# Patient Record
Sex: Female | Born: 1990 | Race: Black or African American | Hispanic: No | Marital: Single | Smoking: Current every day smoker
Health system: Southern US, Community
[De-identification: ages and names within clinical notes are randomized; demographics above are authoritative.]

---
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
## 2011-12-25 ENCOUNTER — Emergency Department (HOSPITAL_BASED_OUTPATIENT_CLINIC_OR_DEPARTMENT_OTHER)
Admission: EM | Admit: 2011-12-25 | Discharge: 2011-12-25 | Disposition: A | Discharge status: Home / Self Care | Payer: No Typology Code available for payment source | Attending: Emergency Medicine | Admitting: Emergency Medicine

## 2011-12-25 ENCOUNTER — Encounter (HOSPITAL_BASED_OUTPATIENT_CLINIC_OR_DEPARTMENT_OTHER): Payer: Self-pay | Admitting: Emergency Medicine

## 2011-12-25 ENCOUNTER — Emergency Department (INDEPENDENT_AMBULATORY_CARE_PROVIDER_SITE_OTHER): Payer: No Typology Code available for payment source

## 2011-12-25 DIAGNOSIS — N10 Acute tubulo-interstitial nephritis: Secondary | ICD-10-CM | POA: Insufficient documentation

## 2011-12-25 DIAGNOSIS — R079 Chest pain, unspecified: Secondary | ICD-10-CM

## 2011-12-25 DIAGNOSIS — R509 Fever, unspecified: Secondary | ICD-10-CM

## 2011-12-25 DIAGNOSIS — R109 Unspecified abdominal pain: Secondary | ICD-10-CM | POA: Insufficient documentation

## 2011-12-25 DIAGNOSIS — R110 Nausea: Secondary | ICD-10-CM | POA: Insufficient documentation

## 2011-12-25 DIAGNOSIS — F17200 Nicotine dependence, unspecified, uncomplicated: Secondary | ICD-10-CM | POA: Insufficient documentation

## 2011-12-25 LAB — URINALYSIS, ROUTINE W REFLEX MICROSCOPIC
Bilirubin Urine: NEGATIVE
Glucose, UA: NEGATIVE mg/dL
Ketones, ur: NEGATIVE mg/dL
Nitrite: POSITIVE — AB
Protein, ur: 30 mg/dL — AB
Specific Gravity, Urine: 1.021 (ref 1.005–1.030)
Urobilinogen, UA: 2 mg/dL — ABNORMAL HIGH (ref 0.0–1.0)
pH: 7.5 (ref 5.0–8.0)

## 2011-12-25 LAB — URINE MICROSCOPIC-ADD ON

## 2011-12-25 LAB — PREGNANCY, URINE: Preg Test, Ur: NEGATIVE

## 2011-12-25 MED ORDER — CEPHALEXIN 500 MG PO CAPS: ORAL_CAPSULE | ORAL | Status: AC

## 2011-12-25 MED ORDER — ACETAMINOPHEN 325 MG PO TABS
ORAL_TABLET | ORAL | Status: AC
  Filled 2011-12-25: qty 2

## 2011-12-25 MED ORDER — ACETAMINOPHEN 325 MG PO TABS
650.0000 mg | ORAL_TABLET | Freq: Once | ORAL | Status: AC
  Administered 2011-12-25: 650 mg via ORAL

## 2011-12-25 MED ORDER — LIDOCAINE HCL (PF) 1 % IJ SOLN
INTRAMUSCULAR | Status: AC
  Administered 2011-12-25: 2.1 mL
  Filled 2011-12-25: qty 5

## 2011-12-25 MED ORDER — METOCLOPRAMIDE HCL 10 MG PO TABS: 10.0000 mg | ORAL_TABLET | Freq: Four times a day (QID) | ORAL | Status: AC | PRN

## 2011-12-25 MED ORDER — OXYCODONE-ACETAMINOPHEN 5-325 MG PO TABS: 2.0000 | ORAL_TABLET | ORAL | Status: AC | PRN

## 2011-12-25 MED ORDER — ONDANSETRON 8 MG PO TBDP: ORAL_TABLET | ORAL | Status: AC

## 2011-12-25 MED ORDER — CEFTRIAXONE SODIUM 1 G IJ SOLR
1.0000 g | Freq: Once | INTRAMUSCULAR | Status: AC
  Administered 2011-12-25: 1 g via INTRAMUSCULAR
  Filled 2011-12-25: qty 10

## 2011-12-25 NOTE — ED Notes (Signed)
Pt c/o left sided abd pain since fri. With nausea. Denies vomiting or diarrhea

## 2011-12-25 NOTE — ED Provider Notes (Signed)
This chart was scribed for Hurman Horn, MD by Wallis Mart. The patient was seen in room MH11/MH11 and the patient's care was started at 8:35 PM.   CSN: [id number]  Arrival date & time 12/25/11  1913   First MD Initiated Contact with Patient 12/25/11 2035      Chief Complaint  Patient presents with  . Abdominal Pain  . Nausea    (Consider location/radiation/quality/duration/timing/severity/associated sxs/prior treatment) HPI  Kiara Estrada is a 21 y.o. female who presents to the Emergency Department complaining of sudden onset, persistence of constant, gradually worsening,  moderate left sided abd/chest wall pain onset 3 days ago.  Pain worsens when breathing, twisting.  Pt c/o fever (current temp = 102.7) and nausea.   Pt denies cough, vomiting, diarrhea.   The pain radiates nowhere. There are no other associated symptoms and no other alleviating or aggravating factors. Pt denies drug use but drinks alcohol. History reviewed. No pertinent past medical history.  History reviewed. No pertinent past surgical history.  No family history on file.  History  Substance Use Topics  . Smoking status: Current Everyday Smoker  . Smokeless tobacco: Not on file  . Alcohol Use: No    OB History    Grav Para Term Preterm Abortions TAB SAB Ect Mult Living                  Review of Systems  Constitutional: Positive for fever.       10 Systems reviewed and are negative for acute change except as noted in the HPI.  HENT: Negative for congestion.   Eyes: Negative for discharge and redness.  Respiratory: Negative for cough and shortness of breath.   Cardiovascular: Negative for chest pain.  Gastrointestinal: Positive for nausea and abdominal pain. Negative for vomiting.  Musculoskeletal: Negative for back pain.  Skin: Negative for rash.  Neurological: Negative for syncope, numbness and headaches.  Psychiatric/Behavioral:       No behavior change.    Allergies  Review of  patient's allergies indicates no known allergies.  Home Medications   Current Outpatient Rx  Name Route Sig Dispense Refill  . NORETHIN ACE-ETH ESTRAD-FE 1.5-30 MG-MCG PO TABS Oral Take 1 tablet by mouth daily.    . CEPHALEXIN 500 MG PO CAPS  2 caps po bid x 14 days 28 capsule 0  . METOCLOPRAMIDE HCL 10 MG PO TABS Oral Take 1 tablet (10 mg total) by mouth every 6 (six) hours as needed (nausea/headache). 6 tablet 0  . ONDANSETRON 8 MG PO TBDP  8mg  ODT q4 hours prn nausea 4 tablet 0  . OXYCODONE-ACETAMINOPHEN 5-325 MG PO TABS Oral Take 2 tablets by mouth every 4 (four) hours as needed for pain. 10 tablet 0    BP 128/70  Pulse 104  Temp(Src) 102.7 F (39.3 C) (Oral)  Resp 18  SpO2 100%  LMP 12/14/2011  Physical Exam  Nursing note and vitals reviewed. Constitutional:       Awake, alert, nontoxic appearance.  HENT:  Head: Atraumatic.  Eyes: Right eye exhibits no discharge. Left eye exhibits no discharge.  Neck: Neck supple.  Cardiovascular: Regular rhythm.   Murmur heard.      tachycardic  Pulmonary/Chest: Effort normal and breath sounds normal. She exhibits tenderness.       Left lower lateral chest tender  Abdominal: Soft. There is no tenderness. There is no rebound.  Musculoskeletal: She exhibits no tenderness.       Baseline ROM, no obvious new  focal weakness.  Neurological:       Mental status and motor strength appears baseline for patient and situation.  Skin: No rash noted.  Psychiatric: She has a normal mood and affect.    ED Course  Procedures (including critical care time) DIAGNOSTIC STUDIES: Oxygen Saturation is 100% on room air, normal by my interpretation.    COORDINATION OF CARE:  8:45 PM: EDP at pt bedside to discuss labwork results.    9:14 PM: EDP at pt bedside, All results reviewed and discussed with pt, questions answered, pt agreeable with plan. Pt ready to be discharged.     Labs Reviewed  URINALYSIS, ROUTINE W REFLEX MICROSCOPIC - Abnormal;  Notable for the following:    APPearance TURBID (*)    Hgb urine dipstick MODERATE (*)    Protein, ur 30 (*)    Urobilinogen, UA 2.0 (*)    Nitrite POSITIVE (*)    Leukocytes, UA SMALL (*)    All other components within normal limits  URINE MICROSCOPIC-ADD ON - Abnormal; Notable for the following:    Bacteria, UA MANY (*)    All other components within normal limits  PREGNANCY, URINE  URINE CULTURE   No results found.   1. Pyelonephritis, acute       MDM  I doubt any other EMC precluding discharge at this time including, but not necessarily limited to the following:sepsis.   I personally performed the services described in this documentation, which was scribed in my presence. The recorded information has been reviewed and considered.       Hurman Horn, MD 12/29/11 2252

## 2011-12-27 LAB — URINE CULTURE

## 2011-12-28 NOTE — ED Notes (Signed)
+  Urine. Patient treated with Keflex. Sensitive to same. Per protocol MD. °

## 2012-08-20 ENCOUNTER — Ambulatory Visit: Payer: Self-pay | Admitting: Family

## 2012-08-21 ENCOUNTER — Ambulatory Visit: Payer: Self-pay | Admitting: Family

## 2012-08-21 DIAGNOSIS — Z0289 Encounter for other administrative examinations: Secondary | ICD-10-CM

## 2012-10-02 ENCOUNTER — Encounter (HOSPITAL_BASED_OUTPATIENT_CLINIC_OR_DEPARTMENT_OTHER): Payer: Self-pay | Admitting: *Deleted

## 2012-10-02 ENCOUNTER — Emergency Department (HOSPITAL_BASED_OUTPATIENT_CLINIC_OR_DEPARTMENT_OTHER)
Admission: EM | Admit: 2012-10-02 | Discharge: 2012-10-02 | Disposition: A | Discharge status: Home / Self Care | Payer: PRIVATE HEALTH INSURANCE | Attending: Emergency Medicine | Admitting: Emergency Medicine

## 2012-10-02 DIAGNOSIS — Z79899 Other long term (current) drug therapy: Secondary | ICD-10-CM | POA: Insufficient documentation

## 2012-10-02 DIAGNOSIS — F17200 Nicotine dependence, unspecified, uncomplicated: Secondary | ICD-10-CM | POA: Insufficient documentation

## 2012-10-02 DIAGNOSIS — R0982 Postnasal drip: Secondary | ICD-10-CM | POA: Insufficient documentation

## 2012-10-02 MED ORDER — DESLORATADINE 5 MG PO TABS: 5.0000 mg | ORAL_TABLET | Freq: Every day | ORAL | Status: AC

## 2012-10-02 MED ORDER — FLUTICASONE PROPIONATE 50 MCG/ACT NA SUSP: 2.0000 | Freq: Every day | NASAL | Status: AC

## 2012-10-02 NOTE — ED Notes (Signed)
Reports productive cough x 1 month.  

## 2012-10-02 NOTE — ED Notes (Signed)
MD at bedside. 

## 2012-10-02 NOTE — ED Provider Notes (Signed)
History     CSN: [id number]  Arrival date & time 10/02/12  5638   First MD Initiated Contact with Patient 10/02/12 0123      Chief Complaint  Patient presents with  . Cough    (Consider location/radiation/quality/duration/timing/severity/associated sxs/prior treatment) Patient is a 21 y.o. female presenting with cough. The history is provided by the patient.  Cough This is a new problem. The current episode started more than 1 week ago (1 month). The problem occurs every few minutes. The problem has not changed since onset.The cough is non-productive. There has been no fever. Associated symptoms include rhinorrhea. Pertinent negatives include no chest pain, no chills, no sweats, no weight loss, no ear congestion and no sore throat. Associated symptoms comments: Nasal congestion. Treatments tried: robitussion. The treatment provided no relief. She is a smoker. Her past medical history does not include pneumonia.    History reviewed. No pertinent past medical history.  History reviewed. No pertinent past surgical history.  No family history on file.  History  Substance Use Topics  . Smoking status: Current Every Day Smoker    Types: Cigars  . Smokeless tobacco: Never Used  . Alcohol Use: 0.5 oz/week    1 drink(s) per week    OB History    Grav Para Term Preterm Abortions TAB SAB Ect Mult Living                  Review of Systems  Constitutional: Negative for chills and weight loss.  HENT: Positive for rhinorrhea and postnasal drip. Negative for sore throat.   Respiratory: Positive for cough.   Cardiovascular: Negative for chest pain.  All other systems reviewed and are negative.    Allergies  Review of patient's allergies indicates no known allergies.  Home Medications   Current Outpatient Rx  Name  Route  Sig  Dispense  Refill  . NYQUIL PO   Oral   Take by mouth.         Azzie Roup ACE-ETH ESTRAD-FE 1.5-30 MG-MCG PO TABS   Oral   Take 1 tablet by mouth  daily.           BP 121/63  Pulse 71  Temp 98.7 F (37.1 C) (Oral)  Resp 20  Ht 5\' 6"  (1.676 m)  Wt 200 lb (90.719 kg)  BMI 32.28 kg/m2  SpO2 99%  LMP 09/11/2012  Physical Exam  Constitutional: She is oriented to person, place, and time. She appears well-developed and well-nourished.  HENT:  Head: Normocephalic and atraumatic.  Mouth/Throat: Oropharynx is clear and moist.       Cobblestoning and drainage down the back of the throat cw with PND  Eyes: Conjunctivae normal are normal. Pupils are equal, round, and reactive to light.  Neck: Normal range of motion. Neck supple.  Cardiovascular: Normal rate and regular rhythm.   Pulmonary/Chest: Effort normal and breath sounds normal. No respiratory distress. She has no wheezes. She has no rales. She exhibits no tenderness.  Abdominal: Soft. Bowel sounds are normal.  Musculoskeletal: Normal range of motion.  Lymphadenopathy:    She has no cervical adenopathy.  Neurological: She is alert and oriented to person, place, and time.  Skin: Skin is warm and dry.  Psychiatric: She has a normal mood and affect.    ED Course  Procedures (including critical care time)  Labs Reviewed - No data to display No results found.   No diagnosis found.    MDM  Will treat for post nasal  drip.  Follow up with your family doctor return for fevers SOB or any concerns        Lamondre Wesche K Prospero Mahnke-Rasch, MD 10/02/12 0130

## 2013-02-17 IMAGING — CR DG CHEST 2V
2 series · 2 of 2 positions shown · non-contrast
Comparison: None.

CLINICAL DATA: Chest pain with fever.

CHEST - 2 VIEW

[w chest pa]
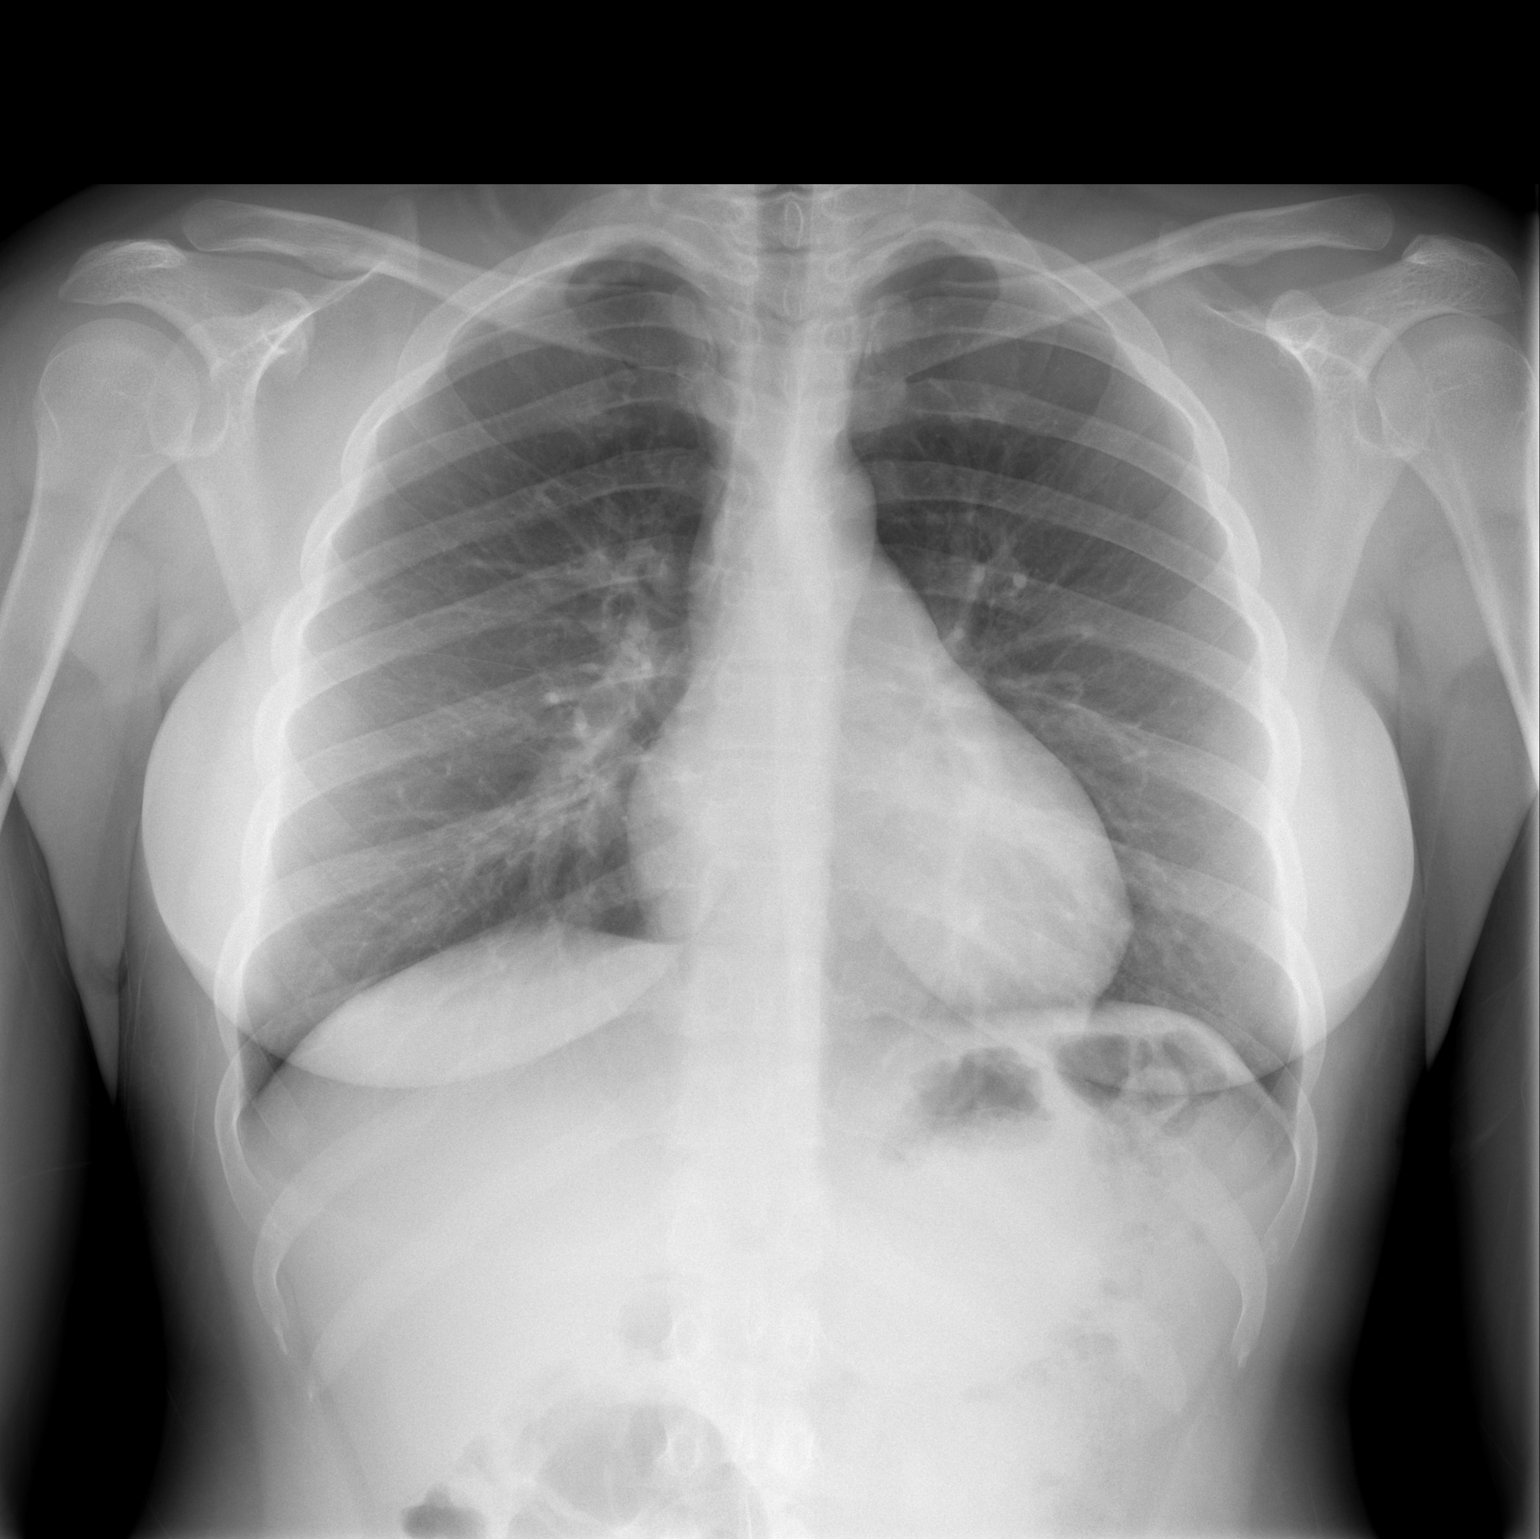

[w chest lat]
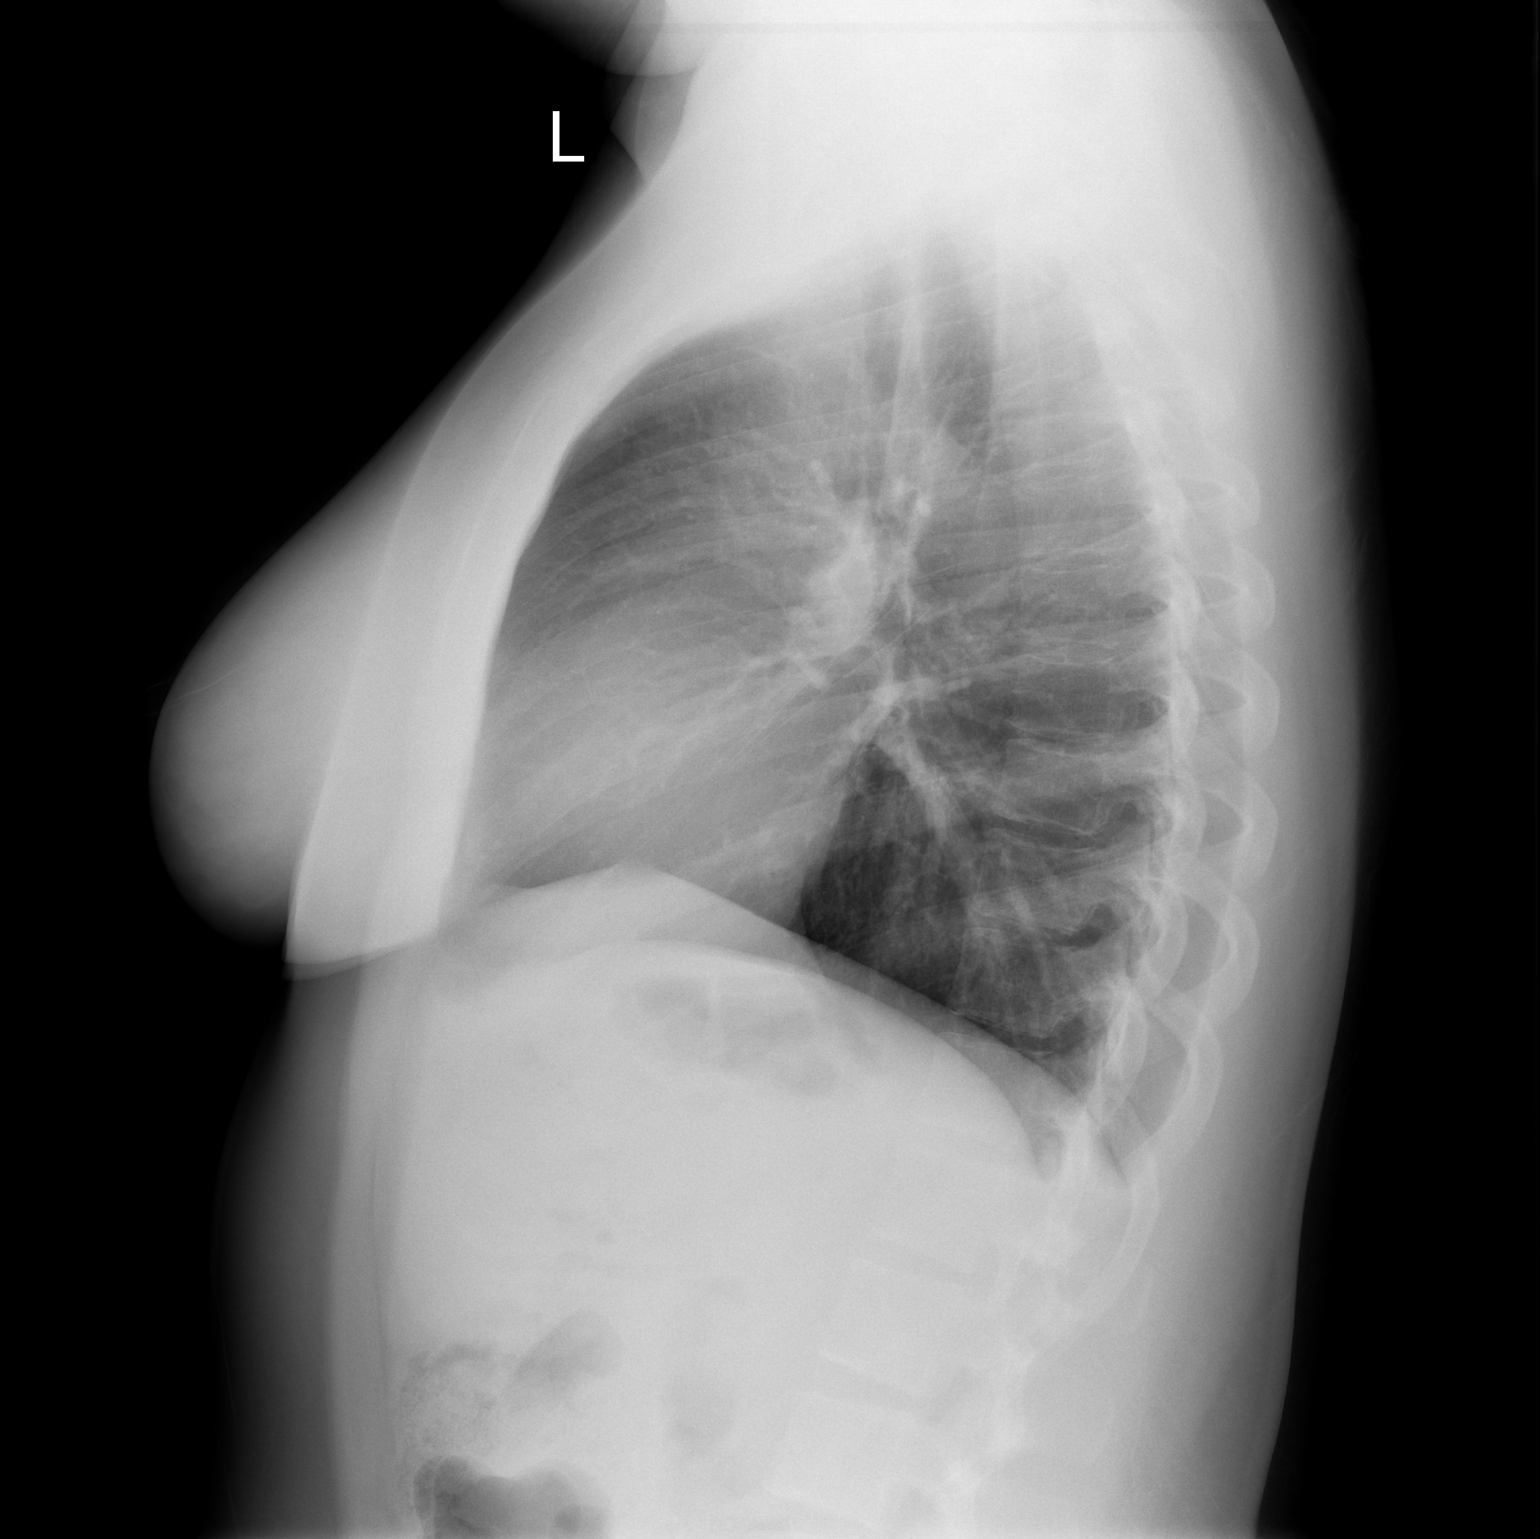

[2 of 2 positions shown; findings below may reference images not displayed]

FINDINGS: The heart size and mediastinal contours are within
normal limits.  Both lungs are clear.  The visualized skeletal
structures are unremarkable.
IMPRESSION: No active cardiopulmonary disease.

## 2013-11-19 ENCOUNTER — Encounter (HOSPITAL_COMMUNITY): Payer: Self-pay

## 2013-11-19 ENCOUNTER — Inpatient Hospital Stay (HOSPITAL_COMMUNITY)
Admission: AD | Admit: 2013-11-19 | Discharge: 2013-11-19 | Disposition: A | Discharge status: Home / Self Care | Payer: 59 | Source: Ambulatory Visit | Attending: Obstetrics and Gynecology | Admitting: Obstetrics and Gynecology

## 2013-11-19 DIAGNOSIS — R0602 Shortness of breath: Secondary | ICD-10-CM | POA: Diagnosis present

## 2013-11-19 DIAGNOSIS — O99330 Smoking (tobacco) complicating pregnancy, unspecified trimester: Secondary | ICD-10-CM | POA: Insufficient documentation

## 2013-11-19 DIAGNOSIS — O479 False labor, unspecified: Secondary | ICD-10-CM | POA: Insufficient documentation

## 2013-11-19 DIAGNOSIS — O99810 Abnormal glucose complicating pregnancy: Secondary | ICD-10-CM | POA: Diagnosis not present

## 2013-11-19 NOTE — MAU Note (Signed)
Patient states she has been getting her prenatal care in [facility] and is scheduled for IOL tonight at 1800 due to GDM, diet controled. Patient states she was out shopping and started having cramping and shortness of breath. Patient denies leaking or bleeding. Reports good fetal movement.

## 2013-11-19 NOTE — Discharge Instructions (Signed)

## 2013-11-19 NOTE — MAU Note (Signed)
Patient and her mother would like to leave and go to [facility] for the scheduled [place]OL. MD will talke with patient.

## 2013-11-19 NOTE — MAU Provider Note (Signed)
  History     CSN: [id number]  Arrival date and time: 11/19/13 1639   None     Chief Complaint  Patient presents with  . Shortness of Breath   Shortness of Breath Pertinent negatives include no chest pain.   Pt presents with shortness of breath and painful contractions starting this afternoon while shopping. Denies VB and LOF. Reports good fetal movement.  OB History   Grav Para Term Preterm Abortions TAB SAB Ect Mult Living   1               No past medical history on file.  No past surgical history on file.  No family history on file.  History  Substance Use Topics  . Smoking status: Current Every Day Smoker    Types: Cigars  . Smokeless tobacco: Never Used  . Alcohol Use: 0.5 oz/week    1 drink(s) per week    Allergies: No Known Allergies  Prescriptions prior to admission  Medication Sig Dispense Refill  . desloratadine (CLARINEX) 5 MG tablet Take 1 tablet (5 mg total) by mouth daily.  7 tablet  0  . fluticasone (FLONASE) 50 MCG/ACT nasal spray Place 2 sprays into the nose daily.  16 g  0  . norethindrone-ethinyl estradiol-iron (LOESTRIN FE 1.5/30) 1.5-30 MG-MCG tablet Take 1 tablet by mouth daily.      . Pseudoeph-Doxylamine-DM-APAP (NYQUIL PO) Take by mouth.        Review of Systems  Respiratory: Positive for shortness of breath. Negative for cough.   Cardiovascular: Negative for chest pain and palpitations.  All other systems reviewed and are negative.   Physical Exam   Blood pressure 118/62, pulse 87, temperature 99.1 F (37.3 C), temperature source Oral, resp. rate 18, height 5\' 6"  (1.676 m), weight 98.249 kg (216 lb 9.6 oz), SpO2 100.00%.  Physical Exam  Nursing note and vitals reviewed. Constitutional: She is oriented to person, place, and time. She appears well-developed and well-nourished. No distress.  HENT:  Head: Normocephalic and atraumatic.  Nose: Nose normal.  Eyes: Conjunctivae are normal. Right eye exhibits no discharge. Left eye  exhibits no discharge. No scleral icterus.  Cardiovascular: Normal rate, regular rhythm and normal heart sounds.   No murmur heard. Respiratory: Effort normal and breath sounds normal. No respiratory distress. She has no wheezes.  GI:  Gravid uterus  Musculoskeletal: She exhibits no edema.  Neurological: She is alert and oriented to person, place, and time.  Skin: Skin is warm and dry. She is not diaphoretic.  Psychiatric: She has a normal mood and affect.    MAU Course  Procedures  MDM FHT reassuring, occasional contractions that are uncomfortable. SOB resolved  Assessment and Plan  Contractions secondary to exertion. SOB resolved. Will d/c to drive to [place] for scheduled induction tonight for GDM.  Beverely Lowdamo, Elena 11/19/2013, 5:32 PM   Seen by me also Agree with note Aviva SignsMarie L Cohen Doleman, CNM

## 2014-09-14 ENCOUNTER — Encounter (HOSPITAL_COMMUNITY): Payer: Self-pay

## 2014-09-24 ENCOUNTER — Encounter (HOSPITAL_COMMUNITY): Payer: Self-pay | Admitting: *Deleted
